# Patient Record
Sex: Male | Born: 1970 | Race: White | Hispanic: No | Smoking: Former smoker
Health system: Southern US, Community
[De-identification: ages and names within clinical notes are randomized; demographics above are authoritative.]

---

## 2017-07-22 ENCOUNTER — Encounter: Payer: Self-pay | Admitting: Neurology

## 2017-07-22 ENCOUNTER — Ambulatory Visit: Payer: 59 | Admitting: Neurology

## 2017-07-22 VITALS — BP 125/88 | HR 93 | Ht 71.0 in | Wt 232.5 lb

## 2017-07-22 DIAGNOSIS — R292 Abnormal reflex: Secondary | ICD-10-CM | POA: Insufficient documentation

## 2017-07-22 DIAGNOSIS — R42 Dizziness and giddiness: Secondary | ICD-10-CM | POA: Diagnosis not present

## 2017-07-22 DIAGNOSIS — T887XXA Unspecified adverse effect of drug or medicament, initial encounter: Secondary | ICD-10-CM | POA: Insufficient documentation

## 2017-07-22 DIAGNOSIS — R259 Unspecified abnormal involuntary movements: Secondary | ICD-10-CM | POA: Insufficient documentation

## 2017-07-22 NOTE — Progress Notes (Signed)
SLEEP MEDICINE CLINIC   Provider:  Melvyn [name], [place] D  Primary Care Physician:  Cole May, MD   Referring Provider: Nathen May, MD   Chief Complaint  Cole [name] presents with  . New Cole [name] (Initial Visit)    Cole [name] has many issues he would like to discuss today.    Cole Cole [name] is referred for "  convulsions" .  Cole Cole [name] is a 47 year old Caucasian right-handed male Cole [name] referred by [facility], [place]. Cole referral office note is from 24 [name] 2010, at Cole time Cole Cole [name] was seen for diabetes mellitus, and for neck stiffness. He had been evaluated by EMG and nerve conduction velocity studies in [place], and reports that he was in seizures for several days after Cole test.  HPI:  Cole Cole [name] is a 47 y.o. male , seen here as in a referral  from Dr. Roxan Cole [name] - for an ill defined reason- what he describes as a sensory abnormalities, hypersensitivity to various stimuli including flickering lights, bluelight spectrum. He has difficulties sleeping since teenage.  He sits is Cole dark exam room, in a crossed leg squat and fluently speaks , reporting his 17 year history of frustration by not being given a diagnosis.  Chief complaint according to Cole [name] : Involuntary muscle movements, twitching without loss of awareness or loss of consciousness.  Sleep habits are as follows: Cole Cole [name] does avoid daytime naps even ever since childhood as not tender him to go to sleep at night.  His regular bedtime is around 10 PM, his bedroom is described as cool, quiet and dark, -Cole only electronic item in his bedroom as his alarm -sometimes he will fall asleep within 10-15 minutes, other evenings not. Prefers to sleep prone or on his side, on one pillow.  He sleeps alone.  His dog sometimes is in Cole bed with him.  He has been told that he [name] snore when he is on his back.Supine sleep causes muscle cramps in Cole central back and flank.  He does not have nocturia.   Sometimes can remember his dreams, and he reports vivid and lucid dreams.He often wakes up spontaneously at night is not quite sure what triggers these arousals.  Most nights he can sleep up to 8 hours, he was recently placed on BuSpar.  He rises at about 6:45 AM, and relies on an alarm. Feels rested.   Sleep medical history and family sleep history: no history of insomnia or hypersomnia in parents or siblings. His daughter has sleep talking- arguing.  Social history: divorced since 2010 , one daughter 75  . Caffeine ; 2-3 cans a day, clear soda with Stevia. Tea once in a while. Coffee- none. Alcohol ; none since 1995.  Tobacco- quit at age 53.  Works as a Magazine features editor, set work hours.    Review of Systems: Out of a complete 14 system review, Cole Cole [name] complains of only Cole following symptoms, and all other reviewed systems are negative.    myo fasciculations, visible to him and others, eye twitching. Photophobia, reports involuntary movements but these allow him to move still purposeful in Cole same limb or body part affected. Buspar prescribed for anxiety, depression.  insomnia . Chronic " hurting ". Cannot function.   Epworth score  N/a  , Fatigue severity score n/a   , depression score n/a    Social History   Socioeconomic History  . Marital status: Divorced    Spouse name: Not on file  .  Number of children: Not on file  . Years of education: Not on file  . Highest education level: Not on file  Social Needs  . Financial resource strain: Not on file  . Food insecurity - worry: Not on file  . Food insecurity - inability: Not on file  . Transportation needs - medical: Not on file  . Transportation needs - non-medical: Not on file  Occupational History  . Not on file  Tobacco Use  . Smoking status: Not on file  Substance and Sexual Activity  . Alcohol use: Not on file  . Drug use: Not on file  . Sexual activity: Not on file  Other Topics Concern  . Not on file  Social History  Narrative  . Not on file    Current Outpatient Medications  Medication Sig Dispense Refill  . busPIRone (BUSPAR) 5 MG tablet Take 2 tablets by mouth 2 (two) times daily.    . Cinnamon 500 MG TABS Take 1,000 mg by mouth daily.    Marland Kitchen DIGESTIVE ENZYMES PO Take 1 capsule by mouth 3 (three) times daily.    . Ginger, Zingiber officinalis, (GINGER ROOT) 550 MG CAPS Take 1 capsule by mouth daily.    . insulin lispro (HUMALOG KWIKPEN) 100 UNIT/ML KiwkPen sliding scale    . LEVEMIR 100 UNIT/ML injection Inject 44 Units into Cole skin 2 (two) times daily.    Marland Kitchen losartan (COZAAR) 100 MG tablet Take 1 tablet by mouth daily.    . Magnesium 250 MG TABS Take 1 tablet by mouth daily.    . Nutritional Supplements (CANDIDA COMPLEX PO) Take 1 capsule by mouth 3 (three) times daily.    . OIL OF OREGANO PO Take by mouth.    . Probiotic Product (SOLUBLE FIBER/PROBIOTICS PO) Take by mouth.     No current facility-administered medications for this visit.     Allergies as of 07/22/2017 - Review Complete 07/22/2017  Allergen Reaction Noted  . Iodine Itching 07/17/2015  . Latex Itching 07/17/2015    Vitals: BP 125/88   Pulse 93   Ht 5\' 11"  (1.803 m)   Wt 232 lb 8 oz (105.5 kg)   BMI 32.43 kg/m  Last Weight:  Wt Readings from Last 1 Encounters:  07/22/17 232 lb 8 oz (105.5 kg)   WJX:BJYN mass index is 32.43 kg/m.     Last Height:   Ht Readings from Last 1 Encounters:  07/22/17 5\' 11"  (1.803 m)    Physical exam:  General: Cole Cole [name] is awake, alert and appears not in acute distress. Cole Cole [name] is well groomed. Head: Normocephalic, atraumatic. Neck is supple. Mallampati 3  neck circumference:15. 5  Nasal airflow patent , crowded dental status, irregular .  Cardiovascular:  Regular rate and rhythm , without  murmurs or carotid bruit, and without distended neck veins. Respiratory: Lungs are clear to auscultation. Skin:  Without evidence of edema, or rash Trunk: BMI is 32. Cole Cole [name]'s posture is  not evaluated.    Neurologic exam : Cole Cole [name] is awake and alert, oriented to place and time.  Attention span & concentration ability appears normal.  Speech is fluent,  without dysarthria, dysphonia or aphasia.  Mood and affect are peculiar .  Cranial nerves: Pupils are equal and briskly reactive to light. Funduscopic exam without evidence of pallor or edema. Extraocular movements  in vertical and horizontal planes intact and without nystagmus. Visual fields by finger perimetry are intact. Hearing to finger rub intact.  Facial sensation intact  to fine touch. Facial motor strength is symmetric,tongue and uvula move midline. Shoulder shrug was symmetrical.  Motor exam:  Normal tone, muscle bulk and symmetric strength in all extremities. Sensory:  Reports to be hypersensitive to touch on his back. Fine touch, pinprick and vibration were intact in all extremities. Proprioception tested in Cole upper extremities was normal. Coordination: Rapid alternating movements in Cole fingers/hands was normal. Finger-to-nose maneuver normal without evidence of ataxia, dysmetria but mild action  tremor.Gait and station: Cole [name] walks without assistive device and is able unassisted to climb up to Cole exam table. Strength within normal limits. Stance is stable and normal. Turns with 3 Steps. Deep tendon reflexes: hyperreflexia- DTR  in Cole  upper and lower extremities are symmetric and intact, but Cole Cole [name] responded with a sudden truncal movement to each patella reflex elicited. Babinski maneuver response is downgoing.   Assessment:  After physical and neurologic examination, review of laboratory studies,  Personal review of imaging studies, reports of other /same  Imaging studies, results of polysomnography and / or neurophysiology testing and pre-existing records as far as provided in visit., my assessment is :  Cole Cole [name] is an insulin-dependent diabetic type I onset at age 77.  I reviewed his current  medications which include Cole NovoLog pen, Levemir, pravastatin, lisinopril, Ultracet, no for fine Lantus, NovoLog.  At Cole time he was on fluoxetine - again Cole son notes spring 2010.  Dr. Lazarus Gowda treated Cole Cole [name] then addressed a left upper extremity pain as probably shingles related and placed Cole Cole [name] on Valtrex.  He no longer takes this medication either.  When he was treated with gabapentin he became so angry and irritable that he closed himself.  He became his normal self after Cole medication was discontinued.  Gabapentin was meant to address possible neuropathic or nerve pain. There are many reported unusual and uncommon hypersensitivities to various medications , dolor saltans et migrans, and involuntary movements.   I have no access to Cole MRI brain that had been obtained around 2010, and Cole results of his NCV and EMG-he has not had MRIs of Cole neck or thoracic spine, he feels that Insomnia is not his main concern and wants Cole " sensory abnormalities " addressed.  I am a little concerned that he is referred based on almost 45 year old notes .   1)  Hyperreflexia- DTR patella caused crossed response.   2)  No visible fasciculation, myoclonus here today. Muscle contractions described as painful .no LOC, loss of awareness, incontinence or tongue bite.   3) Insomnia- improved on Buspar.   4) headaches involving Cole whole upper head= he calls these Migraine, nausea, photophobia.    Cole Cole [name] was advised of Cole nature of Cole diagnosed disorder , Cole treatment options and Cole  risks for general health and wellness arising from not treating Cole condition.   I spent more than 50 minutes of face to face time with Cole Cole [name].  Greater than 50% of time was spent in counseling and coordination of care. We have discussed Cole diagnosis and differential and I answered Cole Cole [name]'s questions.    Plan:  Treatment plan and additional workup :  Ck and CKMB- for myalgia.  Hyperreflexia , cervical  spine and thoracic MRI were denied on Cole basis of hyper reflexia at Cole knee. Will order plain X ray neck and thoracic spine.  Cole [name] declined EMG NCV-  I want him to have those for Cole hands and wrist, upper extremities only.  Frequent headaches- sleep hygiene, migraine prevention dicussed. Marland Kitchen.    Cole NovasARMEN Clothilde Tippetts, MD 07/22/2017, 3:28 PM  Certified in Neurology by ABPN Certified in Sleep Medicine by [facility]  [facility] [address], [address] [place], [place] [address]

## 2017-07-23 LAB — CK TOTAL AND CKMB (NOT AT ARMC)
CK-MB Index: <1 ng/mL (ref 0.0–10.4)
Total CK: 59 U/L (ref 24–204)

## 2017-07-24 ENCOUNTER — Telehealth: Payer: Self-pay | Admitting: Neurology

## 2017-07-24 NOTE — Telephone Encounter (Signed)
-----   Message from Melvyn Novasarmen Dohmeier, MD sent at 07/23/2017  4:51 PM EST ----- No abnormal muscle enzymes noted.

## 2017-07-24 NOTE — Telephone Encounter (Signed)
Called and made the patient aware that lab work looked good. Pt verbalized understanding. Pt had no questions at this time but was encouraged to call back if questions arise.

## 2017-07-28 ENCOUNTER — Telehealth: Payer: Self-pay | Admitting: Neurology

## 2017-07-28 NOTE — Telephone Encounter (Signed)
Jerene PitchMegan Burgess, LPN  [phone] called and wanted to let our office know that they received the office note on patient and it stated that you are treating the patient for movement disorder but they wanted to make sure that this is for seizures. If you have any questions you can contact the nurse Jerene PitchMegan Burgess.

## 2017-07-28 NOTE — Telephone Encounter (Signed)
Look at my note.

## 2017-07-30 ENCOUNTER — Ambulatory Visit: Payer: 59 | Admitting: Diagnostic Neuroimaging

## 2017-08-15 ENCOUNTER — Ambulatory Visit: Payer: 59 | Admitting: Neurology

## 2017-08-15 ENCOUNTER — Encounter (INDEPENDENT_AMBULATORY_CARE_PROVIDER_SITE_OTHER): Payer: 59

## 2017-08-15 DIAGNOSIS — Z0289 Encounter for other administrative examinations: Secondary | ICD-10-CM

## 2017-08-15 DIAGNOSIS — R292 Abnormal reflex: Secondary | ICD-10-CM

## 2017-08-15 DIAGNOSIS — T887XXA Unspecified adverse effect of drug or medicament, initial encounter: Secondary | ICD-10-CM

## 2017-08-15 DIAGNOSIS — R258 Other abnormal involuntary movements: Secondary | ICD-10-CM

## 2017-08-15 DIAGNOSIS — R42 Dizziness and giddiness: Secondary | ICD-10-CM

## 2017-08-15 DIAGNOSIS — R259 Unspecified abnormal involuntary movements: Secondary | ICD-10-CM

## 2017-08-15 NOTE — Procedures (Signed)
        Full Name: Tacy LearnMichael Tener Gender: Male MRN #: [id number] Date of Birth: 09-28-1970    Visit Date: 08/15/2017 10:47 Age: [age] Years 9 Months Old Examining Physician: Levert FeinsteinYijun Ladislav Caselli, MD  Referring Physician: Dohmeier, MD History: 47 years old male presented with body jerking movement and muscle achy pain.  Summary of the tests: Nerve conduction study: Right median ulnar sensory and motor responses were normal.  Right radial sensory response was normal   Electromyography: Selective needle examinations of right upper extremity muscles were normal.  Conclusion: This is normal study.  There is no electrodiagnostic evidence of right upper extremity neuropathy, or inflammatory myopathy.    ------------------------------- Levert FeinsteinYijun Lillyan Hitson M.D.  [facility] [address] [place], [place] [address] Tel: [phone] Fax: [phone]        [facility]    Nerve / Sites Muscle Latency Ref. Amplitude Ref. Rel Amp Segments Distance Velocity Ref. Area    ms ms mV mV %  cm m/s m/s mVms  R Median - APB     Wrist APB 3.6 ?4.4 4.3 ?4.0 100 Wrist - APB 7   15.2     Upper arm APB 7.5  4.4  103 Upper arm - Wrist 21 54 ?49 16.5  R Ulnar - ADM     Wrist ADM 2.4 ?3.3 8.2 ?6.0 100 Wrist - ADM 7   24.6     B.Elbow ADM 5.8  8.1  98.7 B.Elbow - Wrist 20 58 ?49 24.1     A.Elbow ADM 7.6  7.7  95.7 A.Elbow - B.Elbow 10 56 ?49 24.2         A.Elbow - Wrist             SNC    Nerve / Sites Rec. Site Peak Lat Ref.  Amp Ref. Segments Distance    ms ms V V  cm  R Radial - Anatomical snuff box (Forearm)     Forearm Wrist 2.2 ?2.9 16 ?15 Forearm - Wrist 10  R Median - Orthodromic (Dig II, Mid palm)     Dig II Wrist 3.0 ?3.4 12 ?10 Dig II - Wrist 13  R Ulnar - Orthodromic, (Dig V, Mid palm)     Dig V Wrist 2.9 ?3.1 9 ?5 Dig V - Wrist 7611           F  Wave    Nerve F Lat Ref.   ms ms  R Ulnar - ADM 26.7 ?32.0       EMG full       EMG Summary Table    Spontaneous MUAP Recruitment  Muscle  IA Fib PSW Fasc Other Amp Dur. Poly Pattern  R. First dorsal interosseous Normal None None None _______ Normal Normal Normal Normal  R. Pronator teres Normal None None None _______ Normal Normal Normal Normal  R. Biceps brachii Normal None None None _______ Normal Normal Normal Normal  R. Deltoid Normal None None None _______ Normal Normal Normal Normal  R. Triceps brachii Normal None None None _______ Normal Normal Normal Normal  R. Extensor digitorum communis Normal None None None _______ Normal Normal Normal Normal

## 2017-08-18 ENCOUNTER — Telehealth: Payer: Self-pay | Admitting: Neurology

## 2017-08-18 NOTE — Telephone Encounter (Signed)
-----   Message from Melvyn Novasarmen Dohmeier, MD sent at 08/15/2017 12:14 PM EST ----- Normal nerve and muscle responses on  upper extremities.  Normal results.  Please share with PCP,  CD

## 2017-08-18 NOTE — Telephone Encounter (Signed)
Called and went over the results with the patient informing him that it was a normal result. The pt states that Dr Vickey Hugerohmeier mentioned that if this was normal then she may order a MRI of this neck. I see where there is a xray of the next ordered. I asked the patient if he has completed that and he states he has not that it is walk in and he hasn't made it there to completed this test. I informed him that we will wait and see what that test shows and then at that time if Dr Vickey Hugerohmeier thinks the MRI is necessary she can order at that time. Pt verbalized understanding.

## 2017-08-19 ENCOUNTER — Ambulatory Visit
Admission: RE | Admit: 2017-08-19 | Discharge: 2017-08-19 | Disposition: A | Discharge status: Home / Self Care | Payer: 59 | Source: Ambulatory Visit | Attending: Neurology | Admitting: Neurology

## 2017-08-19 DIAGNOSIS — R42 Dizziness and giddiness: Secondary | ICD-10-CM

## 2017-08-19 DIAGNOSIS — R292 Abnormal reflex: Secondary | ICD-10-CM

## 2017-08-19 DIAGNOSIS — R259 Unspecified abnormal involuntary movements: Secondary | ICD-10-CM

## 2017-08-19 DIAGNOSIS — T887XXA Unspecified adverse effect of drug or medicament, initial encounter: Secondary | ICD-10-CM

## 2017-08-21 ENCOUNTER — Telehealth: Payer: Self-pay | Admitting: Neurology

## 2017-08-21 ENCOUNTER — Other Ambulatory Visit: Payer: Self-pay | Admitting: Neurology

## 2017-08-21 DIAGNOSIS — R292 Abnormal reflex: Secondary | ICD-10-CM

## 2017-08-21 NOTE — Telephone Encounter (Signed)
-----   Message from Melvyn Novasarmen Dohmeier, MD sent at 08/20/2017  5:46 PM EST ----- No evidence of spinal stenosis, bulging or herniation of discs.

## 2017-08-21 NOTE — Telephone Encounter (Signed)
Called the patient to make him aware of the results. The patient mentioned that there was talk at office visit of getting a MRI completed if this all was negative. Pt is asking what the next step is. I informed him that we typically discuss this in the follow up apt which he has scheduled 5/2. Pt wanted me to inquire with Dr Vickey Hugerohmeier if she feels any further testing should be completed and if so can we get them completed before the apt in may. I informed the patient I would talk with the doctor about this and see what she advises and then call back with her recommendations.

## 2017-08-21 NOTE — Telephone Encounter (Signed)
MRI order placed.

## 2017-08-27 ENCOUNTER — Ambulatory Visit: Payer: 59 | Admitting: Neurology

## 2017-08-30 ENCOUNTER — Ambulatory Visit
Admission: RE | Admit: 2017-08-30 | Discharge: 2017-08-30 | Disposition: A | Discharge status: Home / Self Care | Payer: 59 | Source: Ambulatory Visit | Attending: Neurology | Admitting: Neurology

## 2017-08-30 DIAGNOSIS — R292 Abnormal reflex: Secondary | ICD-10-CM | POA: Diagnosis not present

## 2017-08-30 MED ORDER — GADOBENATE DIMEGLUMINE 529 MG/ML IV SOLN
20.0000 mL | Freq: Once | INTRAVENOUS | Status: AC | PRN
  Administered 2017-08-30: 20 mL via INTRAVENOUS

## 2017-09-01 ENCOUNTER — Other Ambulatory Visit: Payer: Self-pay | Admitting: Neurology

## 2017-09-03 ENCOUNTER — Telehealth: Payer: Self-pay | Admitting: Neurology

## 2017-09-03 NOTE — Telephone Encounter (Signed)
-----   Message from Melvyn Novasarmen Dohmeier, MD sent at 09/01/2017  8:53 AM EST ----- Hyperreflexia is explained by Cervical spine findings : Mild to moderate spinal stenosis without impingement of cord or exiting nerves- this condition may best be followed by a non surgical rehab / physiatry specialist. EMG and NCV were normal.    Cc Dr Milas KocherAshley Ronison, MD

## 2017-09-03 NOTE — Telephone Encounter (Signed)
Called the patient to make him aware of the MRI findings. The patient already has an apt scheduled in May but is asking to be seen sooner if something comes open so that he can discuss his concerns. He doesn't understand why he is still having the problems he is having with all the test coming back normal. I have placed him on the wait list and will call him if something opens up sooner. Pt verbalized understanding.

## 2017-09-16 NOTE — Telephone Encounter (Signed)
Called the patient today to offer earlier apt since he asked to be on the wait list. This was 2nd time offering a earlier apt and he was unable to take it last min. I told him we will try again. Pt verbalized understanding.

## 2017-10-06 ENCOUNTER — Ambulatory Visit: Payer: 59 | Admitting: Neurology

## 2017-10-06 ENCOUNTER — Encounter: Payer: Self-pay | Admitting: Neurology

## 2017-10-06 VITALS — BP 138/86 | HR 90 | Ht 71.0 in | Wt 237.0 lb

## 2017-10-06 DIAGNOSIS — R253 Fasciculation: Secondary | ICD-10-CM

## 2017-10-06 NOTE — Progress Notes (Addendum)
SLEEP MEDICINE CLINIC   Provider:  Melvyn Novas, [place]  Primary Care Physician:  Nathen May, MD   Referring Provider: Nathen May, MD   Chief Complaint  Patient presents with  . Follow-up    pt alone, rm 11, pt here to discuss his test that he has completed and what next treatment plan course would be.    The patient was referred for "  convulsions" .  Mr. Chartrand is a 47 year old Caucasian right-handed male patient referred by [facility] [place], [place].The referral office note is from 28 Nov 2008, at the time the patient was seen for diabetes mellitus, and for neck stiffness. He had been evaluated by EMG and nerve conduction velocity studies in [place], and reports that he was in seizures for several days after the test. HPI:  Oluwadamilare Tobler is a 47 y.o. male , seen here as in a referral  from Dr. Roxan Hockey - for an ill defined reason- what he describes as a sensory abnormalities, hypersensitivity to various stimuli including flickering lights, bluelight spectrum. He has difficulties sleeping since teenage.  He sits is the dark exam room, in a crossed leg squat and fluently speaks , reporting his 17 year history of frustration by not being given a diagnosis. Chief complaint according to patient : Involuntary muscle movements, twitching without loss of awareness or loss of consciousness.  Sleep habits are as follows: Mr. Lyon does avoid daytime naps even ever since childhood as not tender him to go to sleep at night.  His regular bedtime is around 10 PM, his bedroom is described as cool, quiet and dark, -the only electronic item in his bedroom as his alarm -sometimes he will fall asleep within 10-15 minutes, other evenings not. Prefers to sleep prone or on his side, on one pillow.  He sleeps alone.  His dog sometimes is in the bed with him.  He has been told that he may snore when he is on his back.Supine sleep causes muscle cramps in the central back and  flank.  He does not have nocturia.  Sometimes can remember his dreams, and he reports vivid and lucid dreams.He often wakes up spontaneously at night is not quite sure what triggers these arousals.  Most nights he can sleep up to 8 hours, he was recently placed on BuSpar.  He rises at about 6:45 AM, and relies on an alarm. Feels rested.   Sleep medical history and family sleep history: no history of insomnia or hypersomnia in parents or siblings. His daughter has sleep talking- arguing. Social history: divorced since 2010 , one daughter 51  . Caffeine ; 2-3 cans a day, clear soda with Stevia. Tea once in a while. Coffee- none. Alcohol ; none since 1995. Tobacco- quit at age 68.  Works as a Magazine features editor, set work hours.    10-06-2017, RV after test . NCV was performed and caused bruising and pain. He was very sore, especially at the Abductor pollices brevis.  Normal results, not ALS ! See also detailed results from his plain x-ray frontal and lateral which did not show any significant abnormality followed by an MRI of the cervical spine which actually did show 2 levels of spinal stenosis but not severe enough to show an impingement of the cord or the exiting nerves.  I demonstrated the images here in 2 planes.  Mr. Banka also was given IV contrast as ordered, which extravasated and caused him to bruise and have pain at  the antebrachial access site.  This has now resolved.  He  still has the abductor pollicis brevis soreness. He has no fasciculations today, but had many after the tests.  I have no explanation for his symptoms. He has seen no correlation to caffeine, calorie intake.   Review of Systems: Out of a complete 14 system review, the patient complains of only the following symptoms, and all other reviewed systems are negative.    myo fasciculations, visible to him and others, eye twitching. Photophobia, reports involuntary movements but these allow him to move still purposeful in the same limb or  body part affected. Buspar prescribed for anxiety, depression.  insomnia . Chronic " hurting ". Cannot function.   Epworth score  N/a  , Fatigue severity score n/a   , depression score n/a    Social History   Socioeconomic History  . Marital status: Divorced    Spouse name: Not on file  . Number of children: Not on file  . Years of education: Not on file  . Highest education level: Not on file  Occupational History  . Not on file  Social Needs  . Financial resource strain: Not on file  . Food insecurity:    Worry: Not on file    Inability: Not on file  . Transportation needs:    Medical: Not on file    Non-medical: Not on file  Tobacco Use  . Smoking status: Former Smoker    Types: Cigarettes  . Smokeless tobacco: Never Used  . Tobacco comment: Quit when he was 23  Substance and Sexual Activity  . Alcohol use: Not Currently    Frequency: Never  . Drug use: Not Currently  . Sexual activity: Not on file  Lifestyle  . Physical activity:    Days per week: Not on file    Minutes per session: Not on file  . Stress: Not on file  Relationships  . Social connections:    Talks on phone: Not on file    Gets together: Not on file    Attends religious service: Not on file    Active member of club or organization: Not on file    Attends meetings of clubs or organizations: Not on file    Relationship status: Not on file  . Intimate partner violence:    Fear of current or ex partner: Not on file    Emotionally abused: Not on file    Physically abused: Not on file    Forced sexual activity: Not on file  Other Topics Concern  . Not on file  Social History Narrative  . Not on file    Current Outpatient Medications  Medication Sig Dispense Refill  . Cinnamon 500 MG TABS Take 1,000 mg by mouth daily.    Marland Kitchen DIGESTIVE ENZYMES PO Take 1 capsule by mouth 3 (three) times daily.    . Ginger, Zingiber officinalis, (GINGER ROOT) 550 MG CAPS Take 1 capsule by mouth daily.    Chilton Si  Tea, Camillia sinensis, (GREEN TEA EXTRACT PO) Take 2 tablets by mouth daily.    . insulin lispro (HUMALOG KWIKPEN) 100 UNIT/ML KiwkPen sliding scale    . LEVEMIR 100 UNIT/ML injection Inject 45 Units into the skin 2 (two) times daily.     Marland Kitchen losartan (COZAAR) 100 MG tablet Take 1 tablet by mouth daily.    . Magnesium 250 MG TABS Take 1 tablet by mouth daily.    . Nutritional Supplements (CANDIDA COMPLEX PO) Take 1  tablet by mouth daily. Candida Cleanse    . OIL OF OREGANO PO Take by mouth.    . Probiotic Product (SOLUBLE FIBER/PROBIOTICS PO) Take by mouth.     No current facility-administered medications for this visit.     Allergies as of 10/06/2017 - Review Complete 10/06/2017  Allergen Reaction Noted  . Iodine Itching 07/17/2015  . Latex Itching 07/17/2015    Vitals: BP 138/86   Pulse 90   Ht 5\' 11"  (1.803 m)   Wt 237 lb (107.5 kg)   BMI 33.05 kg/m  Last Weight:  Wt Readings from Last 1 Encounters:  10/06/17 237 lb (107.5 kg)   EXB:MWUX mass index is 33.05 kg/m.     Last Height:   Ht Readings from Last 1 Encounters:  10/06/17 5\' 11"  (1.803 m)    Physical exam:  General: The patient is awake, alert and appears not in acute distress. The patient is well groomed. Head: Normocephalic, atraumatic. Neck is supple. Mallampati 3  neck circumference:15. 5  Nasal airflow patent , crowded dental status, irregular .  Cardiovascular:  Regular rate and rhythm , without  murmurs or carotid bruit, and without distended neck veins. Respiratory: Lungs are clear to auscultation. Skin:  Without evidence of edema, or rash Trunk: BMI is 32. The patient's posture is not evaluated.    Neurologic exam : The patient is awake and alert, oriented to place and time.  Attention span & concentration, pressured speech. Speech is fluent,  without dysarthria, dysphonia or aphasia.  Mood and affect are peculiar .  Cranial nerves: Pupils are equal and briskly reactive to light. Visual fields by  finger perimetry are intact. Hearing to finger rub intact.  Facial sensation intact to fine touch. Facial motor strength is symmetric,tongue and uvula move midline. Shoulder shrug was symmetrical.  Motor exam:  Normal tone, muscle bulk and symmetric strength in all extremities. Sensory:  Reports to be hypersensitive to touch on his back. Fine touch, pinprick and vibration were intact in all extremities. Proprioception tested in the upper extremities was normal. Coordination: Rapid alternating movements in the fingers/hands was normal. Finger-to-nose maneuver without evidence of ataxia, dysmetria but mild action  Tremor. Gait and station: Patient walks without assistive device and is able unassisted to climb up to the exam table. Strength within normal limits. Stance is stable and normal. Turns with 3 Steps. Deep tendon reflexes: hyperreflexia- DTR  in the  upper and lower extremities are symmetric and intact, but the patient responded with a brisk patella reflex elicited. Babinski maneuver response is downgoing.  NEUROIMAGING REPORT STUDY DATE: 08/30/2017 PATIENT NAME: Gustavo Dispenza DOB: 02-07-1971 MRN: [id number]  EXAM: MRI of the cervical spine with and without contrast  ORDERING CLINICIAN: Melvyn Novas M.D. CLINICAL HISTORY: 47 year old man with hyperreflexia COMPARISON FILMS: None  TECHNIQUE: MRI of the cervical spine was obtained utilizing 3 mm sagittal slices from the posterior fossa down to the T3-4 level with T1, T2 and inversion recovery views. In addition 4 mm axial slices from C2-3 down to T1-2 level were included with T2 and gradient echo views. After the infusion of contrast, additional T1-weighted images were performed. CONTRAST: 20 mL MultiHance IMAGING SITE: [facility], [address] [place], [place], [place]  FINDINGS: :  On sagittal images, the spine is imaged from above the cervicomedullary junction to T2.  Paravertebral soft tissue appears normal. The spinal  cord is of normal caliber and signal.   There is straightening of the cervical curvature. Disc height is maintained at  every level. There is no spondylolisthesis.   The vertebral bodies have normal signal.    The discs and interspaces were further evaluated on axial views from C2 to T1 as follows:  C2-C3: The disc appears normal. There is mild left facet hypertrophy. Neural foramina are not significantly narrowed and there is no nerve root compression.  C3-C4: The disc and interspace appear normal.  C4-C5: There is borderline spinal stenosis due to a combination of minimal disc bulging and congenitally short pedicles. Neural foramina are widely patent and there is no nerve root compression or spinal cord compression  C5-C6: There is mild to moderate spinal stenosis due to a combination of midline disc protrusion and congenitally short pedicles. Neural foramina are not significantly narrowed and there is no nerve root compression or spinal cord compression.  C6-C7: The central canal is narrowed but not enough to be considered spinal stenosis due to disc bulging more to the left and congenitally short pedicles. There is mild left foraminal narrowing but no nerve root compression or spinal cord compression.  C7-T1: The disc and interspace appear normal.  After the infusion of contrast, a normal enhancement pattern is observed.   IMPRESSION:  This MRI of the cervical spine with and without contrast shows the following: 1.    There is borderline spinal stenosis at C4-C5 due to congenitally short pedicles and minimal disc bulging. There is no nerve root or spinal cord compression. 2.    There is mild to moderate spinal stenosis at C5-C6 due to congenitally short pedicles and midline disc protrusion. There is no nerve root or spinal cord compression. 3.    There is disc bulging to the left and congenitally short pedicles at C6-C7 causing mild left foraminal narrowing but no nerve root or  spinal cord compression. 4.    The spinal cord has normal signal 5.    There is a normal enhancement pattern.   INTERPRETING PHYSICIAN:  Richard A. Epimenio FootSater, MD, PhD, FAAN Certified in  Neuroimaging by [employer]   Assessment:  After physical and neurologic examination, review of laboratory studies,  Personal review of imaging studies, reports of other /same  Imaging studies, results of polysomnography and / or neurophysiology testing and pre-existing records as far as provided in visit., my assessment is :  Mr. Janee Mornhompson is an insulin-dependent diabetic type I onset at age 47.  I reviewed his current medications which include the NovoLog pen, Levemir, pravastatin, lisinopril, Ultracet, no for fine Lantus, NovoLog.  At the time he was on fluoxetine - Dr. Lazarus Gowdaeif treated the patient, then addressed a left upper extremity pain as probably shingles related and placed the patient on Valtrex.   He no longer takes this medication either.  When he was treated with gabapentin he became so angry and irritable he had to quit .  He became his normal self after the medication was discontinued.  Gabapentin/ Neurontin  was meant to address possible neuropathic or nerve pain. There are many reported unusual and uncommon hypersensitivities to various medications , dolor saltans et migrans, and involuntary movements.   I have not found a physiological reason for the ROS-   I)  No visible fasciculation, myoclonus here again today. The muscle contractions described as painful -no LOC, loss of awareness, incontinence or tongue bite.  Not a seizure.   2) Insomnia- improved on Buspar. He reports  shaking , sweating on this medication.    The patient was advised of the nature of the  diagnosed disorder , the treatment options and the  risks for general health and wellness arising from not treating the condition.   I spent more than 15 minutes of face to face time with the patient.  Greater than  50% of time was spent in counseling and coordination of care. We have discussed the diagnosis and differential and I answered the patient's questions.    Plan:  Treatment plan and additional workup :  I have no further help to offer. May be Mr. Thompsons PCP can refer to a tertiary care center for further work up,  some of the  Described symptoms could have occurred in a patient with  JME, but he lacks the morning onset of symptoms and has not lost awareness. Consider neuromuscular specialist work up at tertiary care, such as [facility].      Melvyn Novas, MD 10/06/2017, 1:52 PM  Certified in Neurology by ABP[employer] Certified in Sleep Medicine by [facility] [address], [address] [place], [place] [address]

## 2017-10-06 NOTE — Patient Instructions (Signed)
I recommend a tertiary are center evaluation.

## 2017-10-21 ENCOUNTER — Ambulatory Visit: Payer: 59 | Admitting: Neurology

## 2017-11-06 ENCOUNTER — Ambulatory Visit: Payer: 59 | Admitting: Neurology

## 2019-12-30 IMAGING — CR DG CERVICAL SPINE COMPLETE 4+V
5 series · 5 of 5 positions shown · non-contrast
Comparison: None in PACs

CLINICAL DATA: Involuntary limb movement, neck spasm, no known
injury.

EXAM:
CERVICAL SPINE - COMPLETE 4+ VIEW

[w c-spine lat]
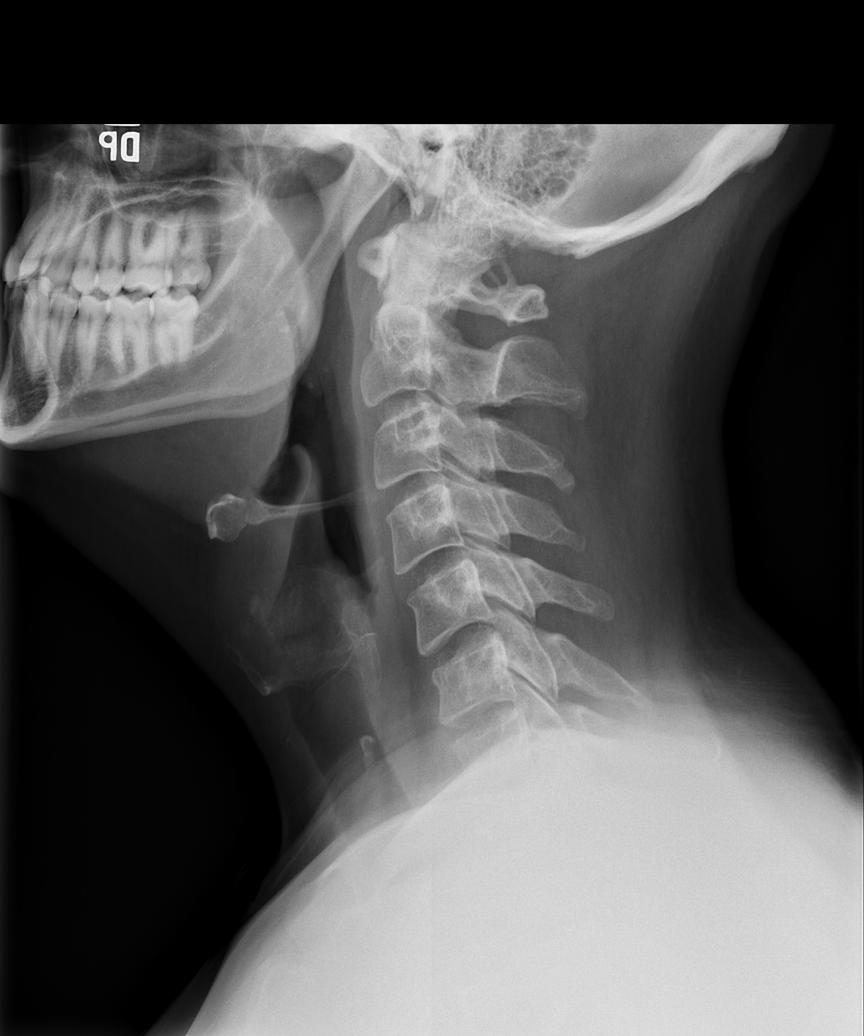

[w c-spine oblique (1 of 2)]
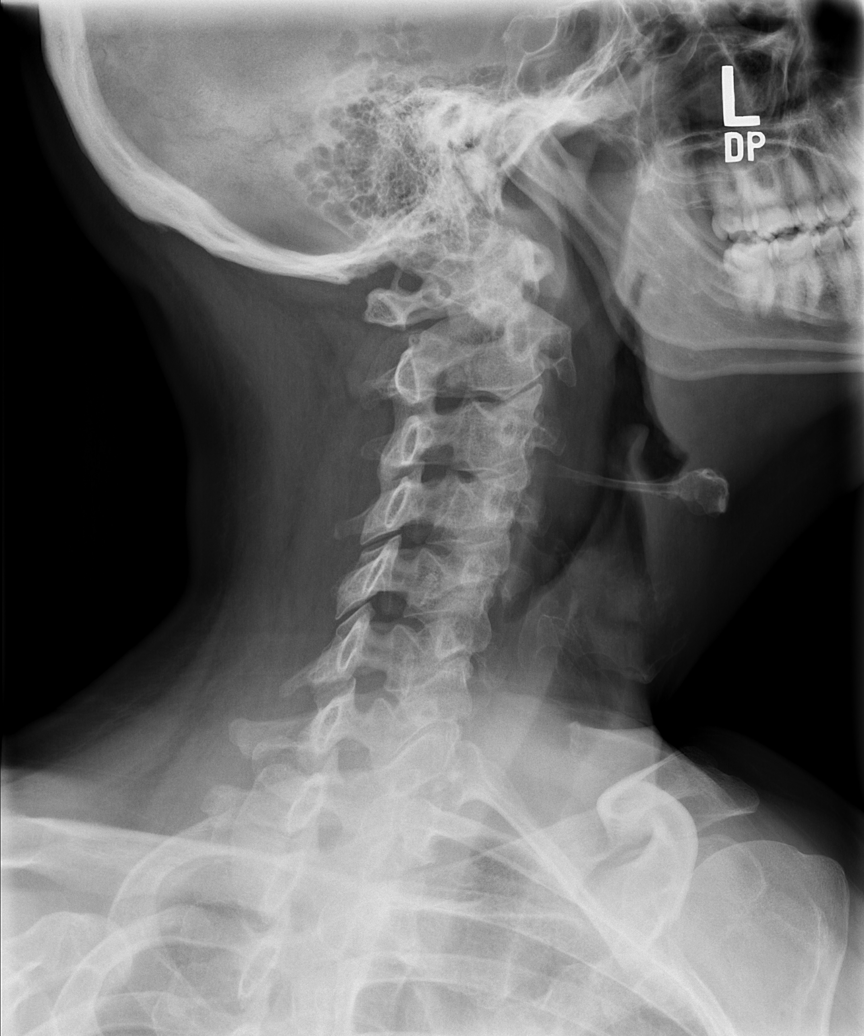

[w c-spine oblique (2 of 2)]
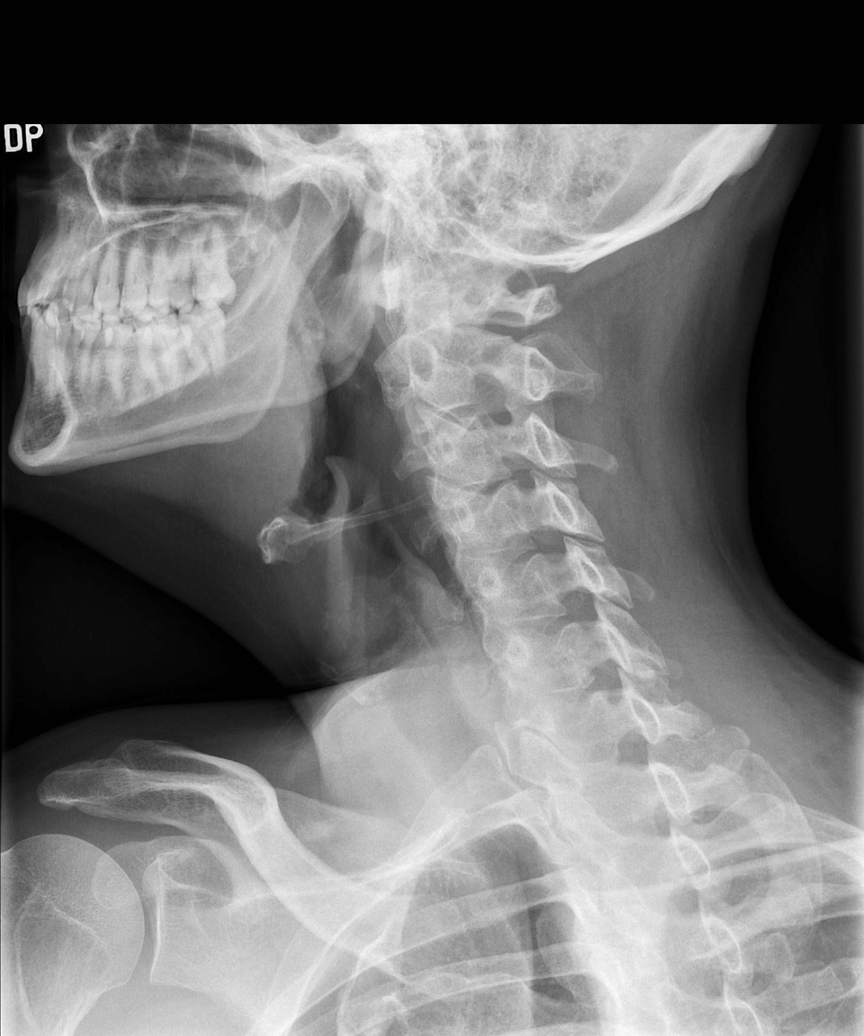

[w c-spine a.p. *]
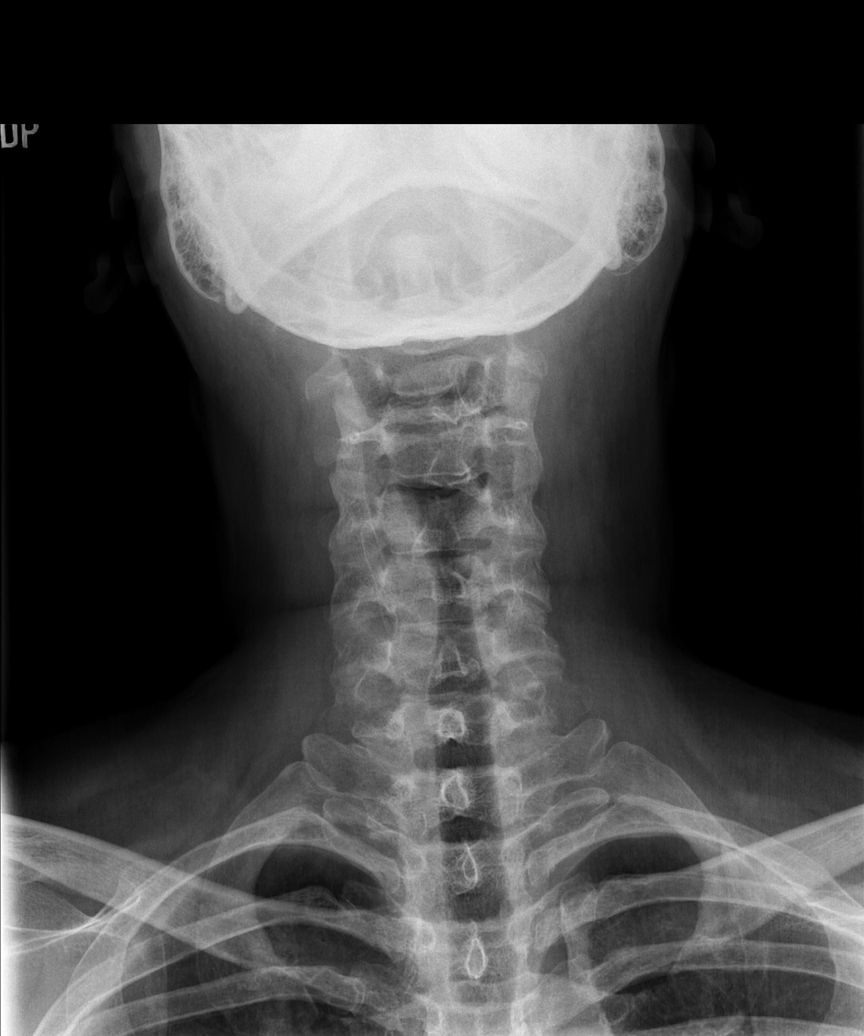

[w c-spine odontoid *]
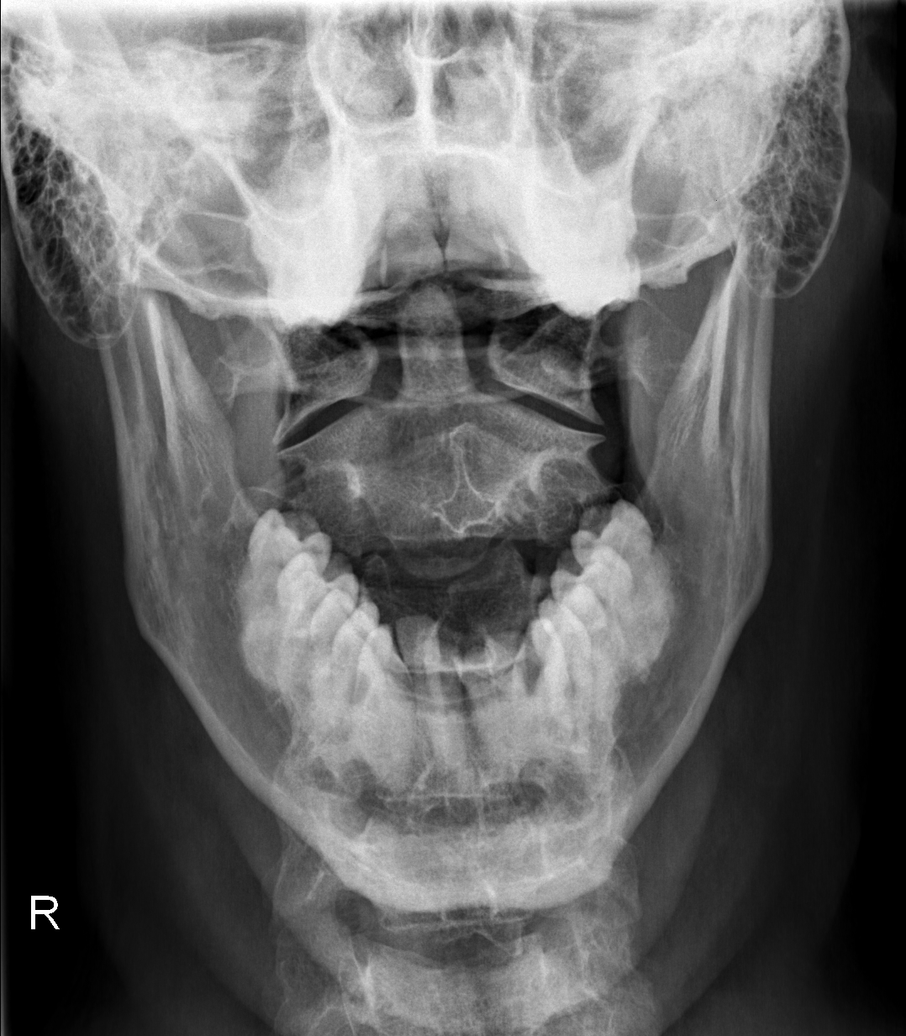

[5 of 5 positions shown; findings below may reference images not displayed]

FINDINGS: The cervical vertebral bodies are preserved in height. The disc
space heights are well maintained. There is no perched facet or
spinous process fracture. The oblique views reveal no bony
encroachment upon the neural foramina. The odontoid is intact. The
prevertebral soft tissue spaces are normal.
IMPRESSION: There is no acute or significant chronic bony abnormality of the
cervical spine.
